# Patient Record
Sex: Female | Born: 1935 | Hispanic: No | Smoking: Never smoker
Health system: Southern US, Community
[De-identification: ages and names within clinical notes are randomized; demographics above are authoritative.]

## PROBLEM LIST (undated history)

## (undated) DIAGNOSIS — F0280 Dementia in other diseases classified elsewhere without behavioral disturbance: Secondary | ICD-10-CM

## (undated) DIAGNOSIS — J449 Chronic obstructive pulmonary disease, unspecified: Secondary | ICD-10-CM

## (undated) DIAGNOSIS — G309 Alzheimer's disease, unspecified: Secondary | ICD-10-CM

## (undated) DIAGNOSIS — I10 Essential (primary) hypertension: Secondary | ICD-10-CM

---

## 2014-07-21 ENCOUNTER — Emergency Department (HOSPITAL_BASED_OUTPATIENT_CLINIC_OR_DEPARTMENT_OTHER): Payer: Medicare Other

## 2014-07-21 ENCOUNTER — Encounter (HOSPITAL_BASED_OUTPATIENT_CLINIC_OR_DEPARTMENT_OTHER): Payer: Self-pay | Admitting: *Deleted

## 2014-07-21 ENCOUNTER — Emergency Department (HOSPITAL_BASED_OUTPATIENT_CLINIC_OR_DEPARTMENT_OTHER)
Admission: EM | Admit: 2014-07-21 | Discharge: 2014-07-21 | Disposition: A | Discharge status: Home / Self Care | Payer: Medicare Other | Attending: Emergency Medicine | Admitting: Emergency Medicine

## 2014-07-21 DIAGNOSIS — Y9389 Activity, other specified: Secondary | ICD-10-CM | POA: Insufficient documentation

## 2014-07-21 DIAGNOSIS — G8929 Other chronic pain: Secondary | ICD-10-CM | POA: Insufficient documentation

## 2014-07-21 DIAGNOSIS — Y998 Other external cause status: Secondary | ICD-10-CM | POA: Insufficient documentation

## 2014-07-21 DIAGNOSIS — S3992XA Unspecified injury of lower back, initial encounter: Secondary | ICD-10-CM | POA: Diagnosis not present

## 2014-07-21 DIAGNOSIS — Y92128 Other place in nursing home as the place of occurrence of the external cause: Secondary | ICD-10-CM | POA: Diagnosis not present

## 2014-07-21 DIAGNOSIS — G309 Alzheimer's disease, unspecified: Secondary | ICD-10-CM | POA: Diagnosis not present

## 2014-07-21 DIAGNOSIS — S0511XA Contusion of eyeball and orbital tissues, right eye, initial encounter: Secondary | ICD-10-CM | POA: Diagnosis not present

## 2014-07-21 DIAGNOSIS — S0083XA Contusion of other part of head, initial encounter: Secondary | ICD-10-CM

## 2014-07-21 DIAGNOSIS — W1830XA Fall on same level, unspecified, initial encounter: Secondary | ICD-10-CM | POA: Diagnosis not present

## 2014-07-21 DIAGNOSIS — W19XXXA Unspecified fall, initial encounter: Secondary | ICD-10-CM

## 2014-07-21 DIAGNOSIS — S0591XA Unspecified injury of right eye and orbit, initial encounter: Secondary | ICD-10-CM | POA: Diagnosis present

## 2014-07-21 HISTORY — DX: Essential (primary) hypertension: I10

## 2014-07-21 HISTORY — DX: Dementia in other diseases classified elsewhere, unspecified severity, without behavioral disturbance, psychotic disturbance, mood disturbance, and anxiety: F02.80

## 2014-07-21 HISTORY — DX: Alzheimer's disease, unspecified: G30.9

## 2014-07-21 MED ORDER — ACETAMINOPHEN 325 MG PO TABS
650.0000 mg | ORAL_TABLET | Freq: Once | ORAL | Status: AC
  Administered 2014-07-21: 650 mg via ORAL
  Filled 2014-07-21: qty 2

## 2014-07-21 NOTE — ED Notes (Signed)
Pt ambulated in room. Pt states legs feel weak but no pain. Will make Dr Madilyn Hookees aware.

## 2014-07-21 NOTE — ED Notes (Signed)
Pt discharged back [facility] via [facility] - Jerrye BeaversHazel, RN gave discharge report staff at facility.

## 2014-07-21 NOTE — ED Notes (Signed)
Report [facility] to [facility] and given to Poyin MT.

## 2014-07-21 NOTE — Discharge Instructions (Signed)
Ms. Heidi Williams was seen in the Emergency Department and had a CT of her head and neck performed.  There were chronic changes on these images.   You can apply an ice pack to her right eye for pain/swelling.     Facial or Scalp Contusion A facial or scalp contusion is a deep bruise on the face or head. Injuries to the face and head generally cause a lot of swelling, especially around the eyes. Contusions are the result of an injury that caused bleeding under the skin. The contusion may turn blue, purple, or yellow. Minor injuries will give you a painless contusion, but more severe contusions may stay painful and swollen for a few weeks.  CAUSES  A facial or scalp contusion is caused by a blunt injury or trauma to the face or head area.  SIGNS AND SYMPTOMS   Swelling of the injured area.   Discoloration of the injured area.   Tenderness, soreness, or pain in the injured area.  DIAGNOSIS  The diagnosis can be made by taking a medical history and doing a physical exam. An X-ray exam, CT scan, or MRI may be needed to determine if there are any associated injuries, such as broken bones (fractures). TREATMENT  Often, the best treatment for a facial or scalp contusion is applying cold compresses to the injured area. Over-the-counter medicines may also be recommended for pain control.  HOME CARE INSTRUCTIONS   Only take over-the-counter or prescription medicines as directed by your health care provider.   Apply ice to the injured area.   Put ice in a plastic bag.   Place a towel between your skin and the bag.   Leave the ice on for 20 minutes, 2-3 times a day.  SEEK MEDICAL CARE IF:  You have bite problems.   You have pain with chewing.   You are concerned about facial defects. SEEK IMMEDIATE MEDICAL CARE IF:  You have severe pain or a headache that is not relieved by medicine.   You have unusual sleepiness, confusion, or personality changes.   You throw up (vomit).   You  have a persistent nosebleed.   You have double vision or blurred vision.   You have fluid drainage from your nose or ear.   You have difficulty walking or using your arms or legs.  MAKE SURE YOU:   Understand these instructions.  Will watch your condition.  Will get help right away if you are not doing well or get worse. Document Released: 09/02/2004 Document Revised: 05/16/2013 Document Reviewed: 03/08/2013 [facility] Patient Information 2015 [place], [place]. This information is not intended to replace advice given to you by your health care provider. Make sure you discuss any questions you have with your health care provider.

## 2014-07-21 NOTE — ED Notes (Signed)
Pt arrived via [facility] from [facility] staff state she came out of room and stated she fell. C/o right swelling with bruising. No LOC. NO other injury noted.

## 2014-07-21 NOTE — ED Provider Notes (Signed)
CSN: [id number]     Arrival date & time 07/21/14  1839 History  This chart was scribed for Heidi FossaElizabeth Sloka Volante, MD by Gwenyth Oberatherine Macek, ED Scribe. This patient was seen in room MH06/MH06 and the patient's care was started at 6:40 PM.    Chief Complaint  Patient presents with  . Fall  . Head Injury   The history is provided by the patient and the EMS personnel. No language interpreter was used.    HPI Comments: Heidi Williams is a 80 y.o. female brought in by EMS, with a history of chronic back pain, who presents to the Emergency Department complaining of a hematoma over her right eye that occurred after an unwitnessed fall earlier today. She notes swelling over her right eye as an associated symptom. [facility]. The facility told EMS that pt picked herself up after the fall and ambulated to the staff for help. EMS states pt was hypertensive with a systolic BP of 168. Pt denies headache, neck pain, CP, visual disturbances and abdominal pain as associated symptoms.  No past medical history on file. No past surgical history on file. No family history on file. History  Substance Use Topics  . Smoking status: Not on file  . Smokeless tobacco: Not on file  . Alcohol Use: Not on file   OB History    No data available     Review of Systems  Eyes: Negative for visual disturbance.  Cardiovascular: Negative for chest pain.  Gastrointestinal: Negative for abdominal pain.  Musculoskeletal: Positive for back pain. Negative for arthralgias and neck pain.  Skin: Positive for wound.  Neurological: Negative for headaches.  All other systems reviewed and are negative.  Allergies  Review of patient's allergies indicates not on file.  Home Medications   Prior to Admission medications   Not on File   BP 160/104 mmHg  Pulse 69  Temp(Src) 97.2 F (36.2 C) (Oral)  Resp 18  Wt 150 lb (68.04 kg)  SpO2 98% Physical Exam  Constitutional: She appears well-developed  and well-nourished.  No acute distress  HENT:  Head: Normocephalic.  Eyes: Pupils are equal, round, and reactive to light.  Mild to moderate swelling with slight ecchymosis around the right eye.  EOMI  Cardiovascular: Normal rate and regular rhythm.   No murmur heard. Pulmonary/Chest: Effort normal and breath sounds normal. No respiratory distress.  Abdominal: Soft. There is no rebound and no guarding.  Musculoskeletal: She exhibits no edema or tenderness.  No hip tenderness  Neurological: She is alert.  Disoriented to time.  MAE symmetrically  Skin: Skin is warm and dry.  Psychiatric: She has a normal mood and affect. Her behavior is normal.  Nursing note and vitals reviewed.   ED Course  Procedures (including critical care time) DIAGNOSTIC STUDIES: Oxygen Saturation is 98% on RA, normal by my interpretation.    COORDINATION OF CARE: 6:45 PM Discussed treatment plan with pt at bedside and pt agreed to plan.  Labs Review Labs Reviewed - No data to display  Imaging Review No results found.   EKG Interpretation None      MDM   Final diagnoses:  Fall, initial encounter  Contusion of face, initial encounter    Patient presents for evaluation of injuries following a fall. Patient has a history of Alzheimer's which limits history. Patient did have an unwitnessed fall but pt was able to get up and alert staff that she fell.  Patient has periorbital  ecchymosis but no other evidence of trauma on exam. There is no evidence of acute intracranial abnormality on her CT head, no fracture on C-spine. Patient without complaints currently. Plan to DC back to facility with fall precautions, ice packs/tylenol prn facial pain.   I personally performed the services described in this documentation, which was scribed in my presence. The recorded information has been reviewed and is accurate.    Heidi FossaElizabeth Precilla Purnell, MD 07/21/14 [phone]

## 2014-10-15 ENCOUNTER — Encounter (HOSPITAL_BASED_OUTPATIENT_CLINIC_OR_DEPARTMENT_OTHER): Payer: Self-pay | Admitting: *Deleted

## 2014-10-15 ENCOUNTER — Emergency Department (HOSPITAL_BASED_OUTPATIENT_CLINIC_OR_DEPARTMENT_OTHER): Payer: Medicare Other

## 2014-10-15 ENCOUNTER — Emergency Department (HOSPITAL_BASED_OUTPATIENT_CLINIC_OR_DEPARTMENT_OTHER)
Admission: EM | Admit: 2014-10-15 | Discharge: 2014-10-15 | Disposition: A | Discharge status: Home / Self Care | Payer: Medicare Other | Attending: Emergency Medicine | Admitting: Emergency Medicine

## 2014-10-15 ENCOUNTER — Other Ambulatory Visit: Payer: Self-pay

## 2014-10-15 DIAGNOSIS — Z7952 Long term (current) use of systemic steroids: Secondary | ICD-10-CM | POA: Diagnosis not present

## 2014-10-15 DIAGNOSIS — G309 Alzheimer's disease, unspecified: Secondary | ICD-10-CM | POA: Insufficient documentation

## 2014-10-15 DIAGNOSIS — I10 Essential (primary) hypertension: Secondary | ICD-10-CM | POA: Insufficient documentation

## 2014-10-15 DIAGNOSIS — Z79899 Other long term (current) drug therapy: Secondary | ICD-10-CM | POA: Diagnosis not present

## 2014-10-15 DIAGNOSIS — R109 Unspecified abdominal pain: Secondary | ICD-10-CM | POA: Diagnosis present

## 2014-10-15 DIAGNOSIS — J449 Chronic obstructive pulmonary disease, unspecified: Secondary | ICD-10-CM | POA: Insufficient documentation

## 2014-10-15 DIAGNOSIS — K429 Umbilical hernia without obstruction or gangrene: Secondary | ICD-10-CM | POA: Insufficient documentation

## 2014-10-15 HISTORY — DX: Chronic obstructive pulmonary disease, unspecified: J44.9

## 2014-10-15 LAB — CBC WITH DIFFERENTIAL/PLATELET
Basophils Absolute: 0 10*3/uL (ref 0.0–0.1)
Basophils Relative: 1 % (ref 0–1)
Eosinophils Absolute: 0.1 10*3/uL (ref 0.0–0.7)
Eosinophils Relative: 3 % (ref 0–5)
HCT: 38.3 % (ref 36.0–46.0)
HEMOGLOBIN: 12.6 g/dL (ref 12.0–15.0)
LYMPHS ABS: 0.9 10*3/uL (ref 0.7–4.0)
LYMPHS PCT: 22 % (ref 12–46)
MCH: 35.1 pg — AB (ref 26.0–34.0)
MCHC: 32.9 g/dL (ref 30.0–36.0)
MCV: 106.7 fL — ABNORMAL HIGH (ref 78.0–100.0)
MONO ABS: 0.5 10*3/uL (ref 0.1–1.0)
Monocytes Relative: 11 % (ref 3–12)
NEUTROS ABS: 2.6 10*3/uL (ref 1.7–7.7)
Neutrophils Relative %: 63 % (ref 43–77)
Platelets: 155 10*3/uL (ref 150–400)
RBC: 3.59 MIL/uL — ABNORMAL LOW (ref 3.87–5.11)
RDW: 14.5 % (ref 11.5–15.5)
WBC: 4.1 10*3/uL (ref 4.0–10.5)

## 2014-10-15 LAB — COMPREHENSIVE METABOLIC PANEL
ALBUMIN: 4.1 g/dL (ref 3.5–5.2)
ALT: 77 U/L — AB (ref 0–35)
AST: 73 U/L — AB (ref 0–37)
Alkaline Phosphatase: 366 U/L — ABNORMAL HIGH (ref 39–117)
Anion gap: 2 — ABNORMAL LOW (ref 5–15)
BILIRUBIN TOTAL: 1 mg/dL (ref 0.3–1.2)
BUN: 16 mg/dL (ref 6–23)
CHLORIDE: 112 mmol/L (ref 96–112)
CO2: 24 mmol/L (ref 19–32)
Calcium: 8.9 mg/dL (ref 8.4–10.5)
Creatinine, Ser: 0.87 mg/dL (ref 0.50–1.10)
GFR calc Af Amer: 68 mL/min — ABNORMAL LOW (ref >90)
GFR calc non Af Amer: 59 mL/min — ABNORMAL LOW (ref >90)
Glucose, Bld: 100 mg/dL — ABNORMAL HIGH (ref 70–99)
Potassium: 4.2 mmol/L (ref 3.5–5.1)
Sodium: 138 mmol/L (ref 135–145)
Total Protein: 7 g/dL (ref 6.0–8.3)

## 2014-10-15 LAB — URINALYSIS, ROUTINE W REFLEX MICROSCOPIC
BILIRUBIN URINE: NEGATIVE
Glucose, UA: NEGATIVE mg/dL
HGB URINE DIPSTICK: NEGATIVE
Ketones, ur: NEGATIVE mg/dL
Leukocytes, UA: NEGATIVE
Nitrite: NEGATIVE
Protein, ur: NEGATIVE mg/dL
Specific Gravity, Urine: 1.01 (ref 1.005–1.030)
Urobilinogen, UA: 0.2 mg/dL (ref 0.0–1.0)
pH: 6 (ref 5.0–8.0)

## 2014-10-15 LAB — TROPONIN I

## 2014-10-15 LAB — OCCULT BLOOD X 1 CARD TO LAB, STOOL: FECAL OCCULT BLD: NEGATIVE

## 2014-10-15 LAB — LIPASE, BLOOD: Lipase: 36 U/L (ref 11–59)

## 2014-10-15 LAB — I-STAT CG4 LACTIC ACID, ED: Lactic Acid, Venous: 0.89 mmol/L (ref 0.5–2.0)

## 2014-10-15 MED ORDER — IOHEXOL 300 MG/ML  SOLN
100.0000 mL | Freq: Once | INTRAMUSCULAR | Status: AC | PRN
  Administered 2014-10-15: 100 mL via INTRAVENOUS

## 2014-10-15 MED ORDER — SODIUM CHLORIDE 0.9 % IV SOLN
INTRAVENOUS | Status: DC
  Administered 2014-10-15: 15:00:00 via INTRAVENOUS

## 2014-10-15 NOTE — ED Notes (Signed)
C/o feeling a small mass to left mid abd slightly below umbilicus. N/v/d.

## 2014-10-15 NOTE — ED Notes (Signed)
Patient c/o lower central abd pain, mass felt by EMS, positive bowel sounds

## 2014-10-15 NOTE — ED Provider Notes (Signed)
CSN: [id number]     Arrival date & time 10/15/14  1354 History   None    Chief Complaint  Patient presents with  . Abdominal Pain     (Consider location/radiation/quality/duration/timing/severity/associated sxs/prior Treatment) Patient is a 81 y.o. female presenting with abdominal pain. A language interpreter was used.  Abdominal Pain    Heidi Williams is a 81 y.o. female [facility] with past medical history significant for Alzheimer's, hypertension and COPD complaining of epigastric abdominal pain that was 6 out of 10 at worst, this pain has largely resolved. Patient denies fever, chills, nausea, vomiting, change in bowel or bladder habits.   Past Medical History  Diagnosis Date  . Alzheimer disease   . Hypertension   . COPD (chronic obstructive pulmonary disease)    History reviewed. No pertinent past surgical history. No family history on file. History  Substance Use Topics  . Smoking status: Never Smoker   . Smokeless tobacco: Not on file  . Alcohol Use: Not on file   OB History    No data available     Review of Systems  Gastrointestinal: Positive for abdominal pain.    10 systems reviewed and found to be negative, except as noted in the HPI.   Allergies  Morphine and related  Home Medications   Prior to Admission medications   Medication Sig Start Date End Date Taking? Authorizing Provider  ALPRAZolam (XANAX) 0.25 MG tablet Take 0.25 mg by mouth 2 (two) times daily.   Yes Historical Provider, MD  donepezil (ARICEPT) 10 MG tablet Take 10 mg by mouth at bedtime.   Yes Historical Provider, MD  ergocalciferol (VITAMIN D2) 50000 UNITS capsule Take 50,000 Units by mouth once a week.   Yes Historical Provider, MD  ferrous sulfate 325 (65 FE) MG tablet Take 325 mg by mouth daily with breakfast.   Yes Historical Provider, MD  fluticasone-salmeterol (ADVAIR HFA) 115-21 MCG/ACT inhaler Inhale 2 puffs into the lungs 2 (two)  times daily.   Yes Historical Provider, MD  gabapentin (NEURONTIN) 600 MG tablet Take 600 mg by mouth 2 (two) times daily.   Yes Historical Provider, MD  lactobacillus acidophilus (BACID) TABS tablet Take 2 tablets by mouth 2 (two) times daily.   Yes Historical Provider, MD  levothyroxine (SYNTHROID, LEVOTHROID) 50 MCG tablet Take 50 mcg by mouth daily before breakfast.   Yes Historical Provider, MD  lisinopril (PRINIVIL,ZESTRIL) 20 MG tablet Take 20 mg by mouth daily.   Yes Historical Provider, MD  memantine (NAMENDA) 10 MG tablet Take 28 mg by mouth daily.   Yes Historical Provider, MD  metoprolol tartrate (LOPRESSOR) 25 MG tablet Take 12.5 mg by mouth 2 (two) times daily.   Yes Historical Provider, MD  omeprazole (PRILOSEC) 20 MG capsule Take 20 mg by mouth daily.   Yes Historical Provider, MD  simvastatin (ZOCOR) 20 MG tablet Take 20 mg by mouth daily.   Yes Historical Provider, MD  traZODone (DESYREL) 50 MG tablet Take 50 mg by mouth at bedtime.   Yes Historical Provider, MD  docusate sodium (COLACE) 100 MG capsule Take 100 mg by mouth 2 (two) times daily.    Historical Provider, MD  ipratropium-albuterol (DUONEB) 0.5-2.5 (3) MG/3ML SOLN Take 3 mLs by nebulization.    Historical Provider, MD  nitroGLYCERIN (NITROSTAT) 0.4 MG SL tablet Place 0.4 mg under the tongue every 5 (five) minutes as needed for chest pain.    Historical Provider, MD  BP 163/83 mmHg  Pulse 62  Temp(Src) 98.4 F (36.9 C) (Oral)  Resp 16  Wt 150 lb (68.04 kg)  SpO2 94% Physical Exam  Constitutional: She is oriented to person, place, and time. She appears well-developed and well-nourished. No distress.  HENT:  Head: Normocephalic.  Eyes: Conjunctivae and EOM are normal.  Cardiovascular: Normal rate.   Pulmonary/Chest: Effort normal. No stridor.  Abdominal: Soft. Bowel sounds are normal. She exhibits no distension and no mass. There is no tenderness. There is no rebound and no guarding.  Vertical lower midline  abdominal scar  Genitourinary:  Digital rectal exam with no rashes or lesions, good rectal tone, dark normally formed stool  Musculoskeletal: Normal range of motion.  Neurological: She is alert and oriented to person, place, and time.  Psychiatric: She has a normal mood and affect.  Nursing note and vitals reviewed.   ED Course  Procedures (including critical care time) Labs Review Labs Reviewed  CBC WITH DIFFERENTIAL/PLATELET - Abnormal; Notable for the following:    RBC 3.59 (*)    MCV 106.7 (*)    MCH 35.1 (*)    All other components within normal limits  COMPREHENSIVE METABOLIC PANEL - Abnormal; Notable for the following:    Glucose, Bld 100 (*)    AST 73 (*)    ALT 77 (*)    Alkaline Phosphatase 366 (*)    GFR calc non Af Amer 59 (*)    GFR calc Af Amer 68 (*)    Anion gap 2 (*)    All other components within normal limits  LIPASE, BLOOD  URINALYSIS, ROUTINE W REFLEX MICROSCOPIC  TROPONIN I  OCCULT BLOOD X 1 CARD TO LAB, STOOL  I-STAT CG4 LACTIC ACID, ED  I-STAT CG4 LACTIC ACID, ED    Imaging Review No results found.   EKG Interpretation None      MDM   Final diagnoses:  Umbilical hernia without obstruction and without gangrene    Filed Vitals:   10/15/14 1401  BP: 163/83  Pulse: 62  Temp: 98.4 F (36.9 C)  TempSrc: Oral  Resp: 16  Weight: 150 lb (68.04 kg)  SpO2: 94%    Medications  0.9 %  sodium chloride infusion ( Intravenous New Bag/Given 10/15/14 1517)    Heidi Williams is a pleasant 81 y.o. female presenting with resolved epigastric abdominal pain. Abdominal exam is benign. Stool is dark, but guaiac is negative. Blood work reassuring, mild transaminitis and increase in alkaline phosphatase.  CT abdomen pelvis with small fat containing umbilical hernia. Serial abdominal exams remain benign.   This is a shared visit with the attending physician who personally evaluated the patient and agrees with the care plan.   Evaluation does not show  pathology that would require ongoing emergent intervention or inpatient treatment. Pt is hemodynamically stable and mentating appropriately. Discussed findings and plan with patient/guardian, who agrees with care plan. All questions answered. Return precautions discussed and outpatient follow up given.      Joni Reining Danzell Birky, PA-C 10/15/14 1610  Pricilla Loveless, MD 10/24/14 [phone]

## 2014-10-15 NOTE — Discharge Instructions (Signed)
Take acetaminophen (Tylenol) up to650 mg (this is normally 2 over-the-counter pills) up to 3 times a day. Do not drink alcohol. Make sure your other medications do not contain acetaminophen (Read the labels!)  Please follow with your primary care doctor in the next 2 days for a check-up. They must obtain records for further management.   Do not hesitate to return to the Emergency Department for any new, worsening or concerning symptoms.    Hernia A hernia occurs when an internal organ pushes out through a weak spot in the abdominal wall. Hernias most commonly occur in the groin and around the navel. Hernias often can be pushed back into place (reduced). Most hernias tend to get worse over time. Some abdominal hernias can get stuck in the opening (irreducible or incarcerated hernia) and cannot be reduced. An irreducible abdominal hernia which is tightly squeezed into the opening is at risk for impaired blood supply (strangulated hernia). A strangulated hernia is a medical emergency. Because of the risk for an irreducible or strangulated hernia, surgery may be recommended to repair a hernia. CAUSES   Heavy lifting.  Prolonged coughing.  Straining to have a bowel movement.  A cut (incision) made during an abdominal surgery. HOME CARE INSTRUCTIONS   Bed rest is not required. You may continue your normal activities.  Avoid lifting more than 10 pounds (4.5 kg) or straining.  Cough gently. If you are a smoker it is best to stop. Even the best hernia repair can break down with the continual strain of coughing. Even if you do not have your hernia repaired, a cough will continue to aggravate the problem.  Do not wear anything tight over your hernia. Do not try to keep it in with an outside bandage or truss. These can damage abdominal contents if they are trapped within the hernia sac.  Eat a normal diet.  Avoid constipation. Straining over long periods of time will increase hernia size and  encourage breakdown of repairs. If you cannot do this with diet alone, stool softeners may be used. SEEK IMMEDIATE MEDICAL CARE IF:   You have a fever.  You develop increasing abdominal pain.  You feel nauseous or vomit.  Your hernia is stuck outside the abdomen, looks discolored, feels hard, or is tender.  You have any changes in your bowel habits or in the hernia that are unusual for you.  You have increased pain or swelling around the hernia.  You cannot push the hernia back in place by applying gentle pressure while lying down. MAKE SURE YOU:   Understand these instructions.  Will watch your condition.  Will get help right away if you are not doing well or get worse. Document Released: 07/26/2005 Document Revised: 10/18/2011 Document Reviewed: 03/14/2008 [facility] Patient Information 2015 [place], [place]. This information is not intended to replace advice given to you by your health care provider. Make sure you discuss any questions you have with your health care provider.

## 2014-10-15 NOTE — ED Notes (Signed)
Report given to [facility] 

## 2014-10-15 NOTE — ED Notes (Signed)
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 "She reports that sometimes when the she the patient)is not getting her way she cries wolf" Selena BattenKim Daughter [phone] please call if needed

## 2016-08-22 IMAGING — CT CT ABD-PELV W/ CM
2 of 5 series · 17 of 46 positions shown, 19 images · IV contrast (omnipaque)
Comparison: 05/10/2014

CLINICAL DATA: Mid abdominal pain below the umbilicus with palpable
mass

EXAM:
CT ABDOMEN AND PELVIS WITH CONTRAST
TECHNIQUE: Multidetector CT imaging of the abdomen and pelvis was performed
using the standard protocol following bolus administration of
intravenous contrast.
CONTRAST:  100mL OMNIPAQUE IOHEXOL 300 MG/ML  SOLN

[Series 2: abd/pelvis 5.0 b31f · axial · 0.71mm/px · z∈[-500,-140]mm · 14 of 81 slices shown, 16 images]
[im 5/81  soft-tissue]
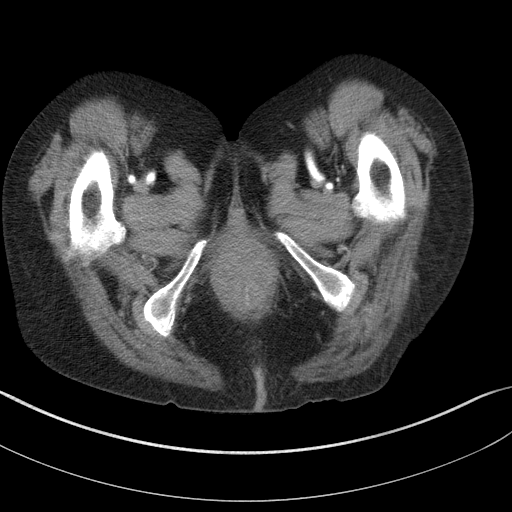
[im 5/81  bone]
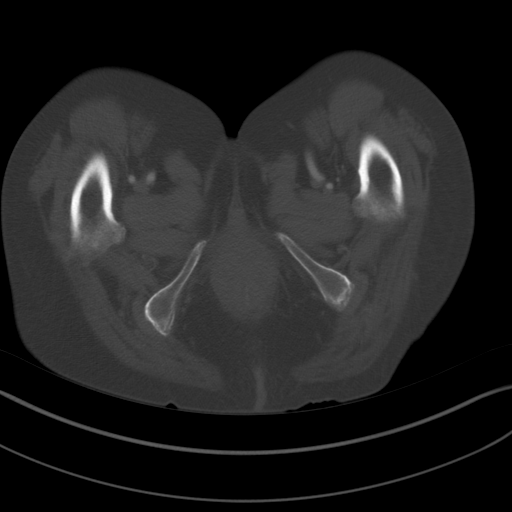
[im 13/81  soft-tissue]
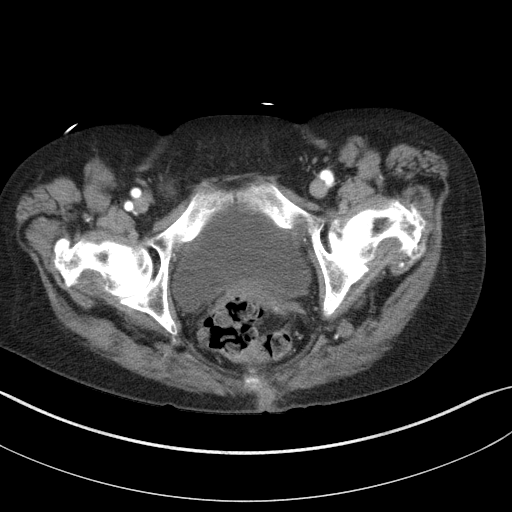
[im 17/81  soft-tissue]
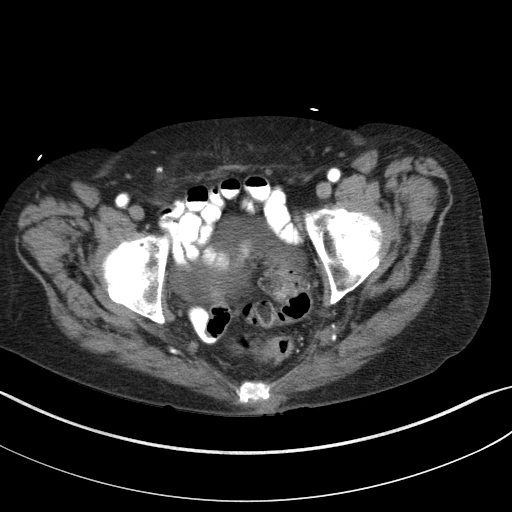
[im 21/81  soft-tissue]
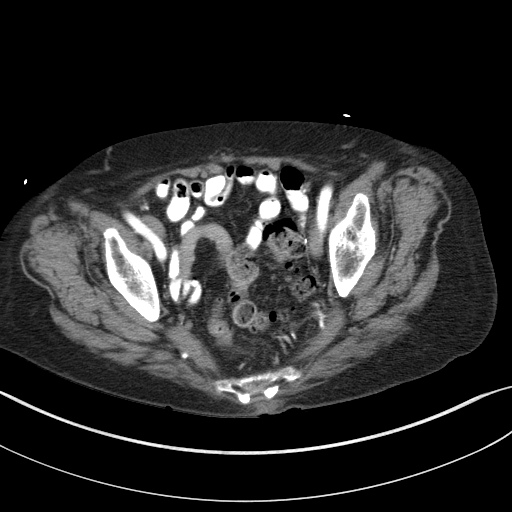
[im 29/81  soft-tissue]
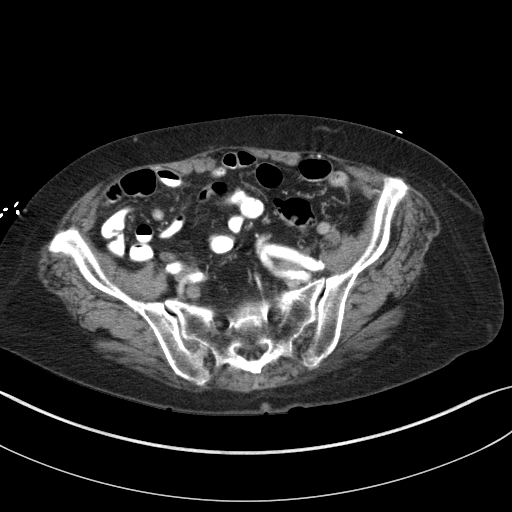
[im 33/81  soft-tissue]
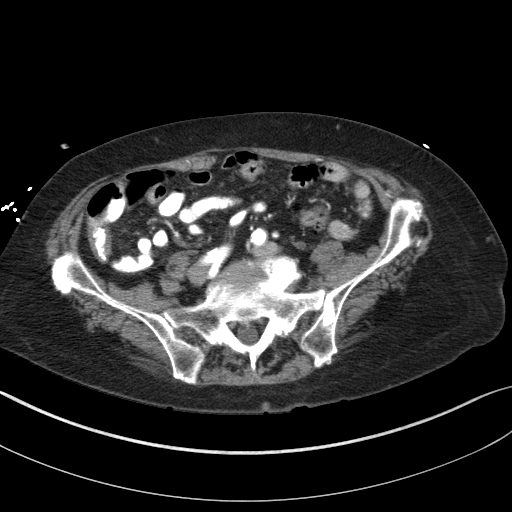
[im 37/81  soft-tissue]
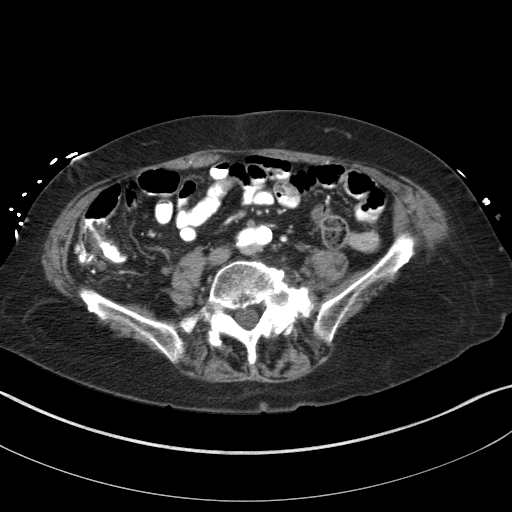
[im 45/81  soft-tissue]
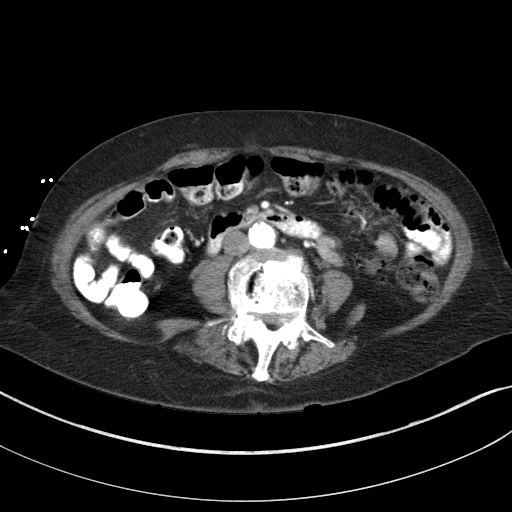
[im 49/81  soft-tissue]
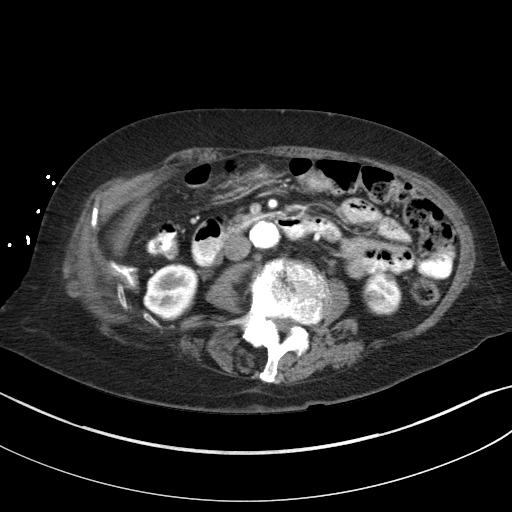
[im 49/81  bone]
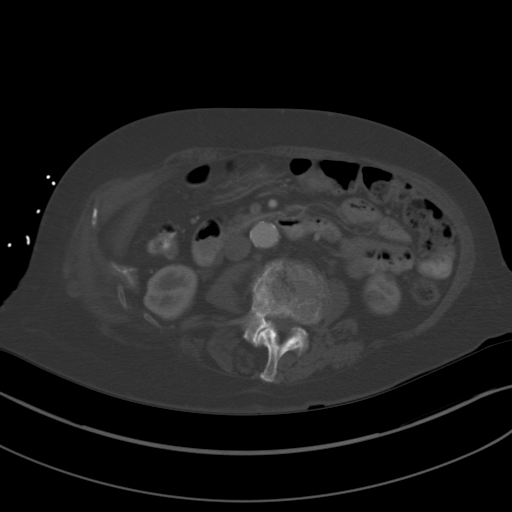
[im 53/81  soft-tissue]
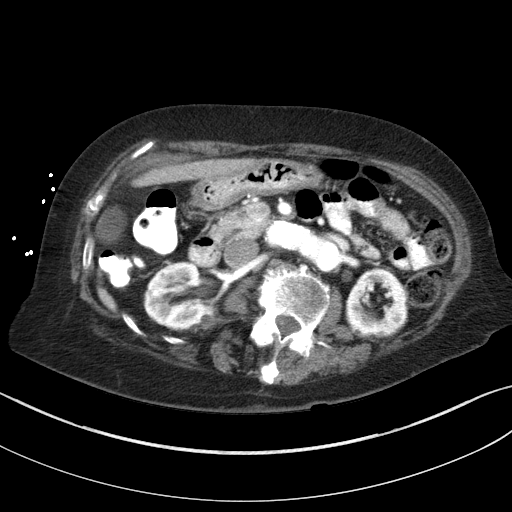
[im 61/81  soft-tissue]
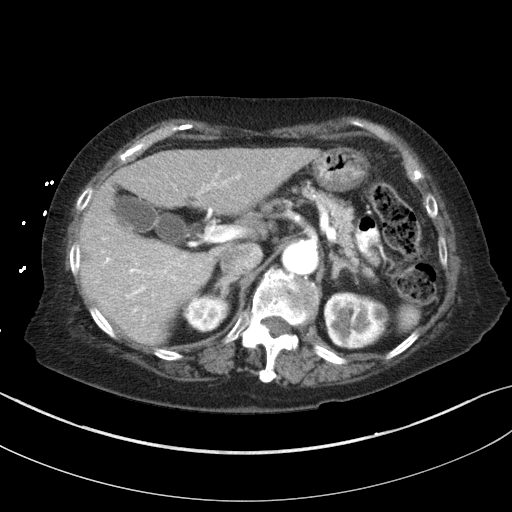
[im 65/81  soft-tissue]
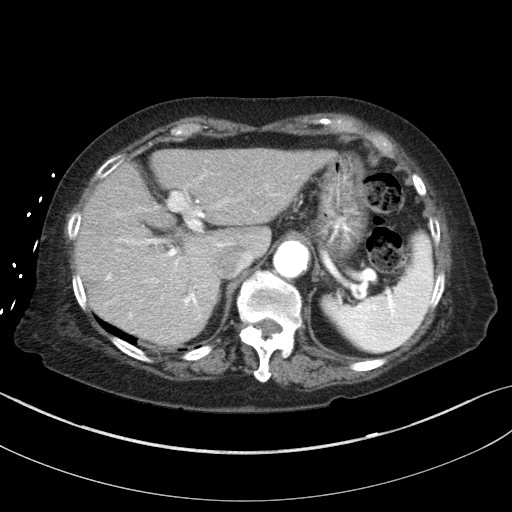
[im 69/81  soft-tissue]
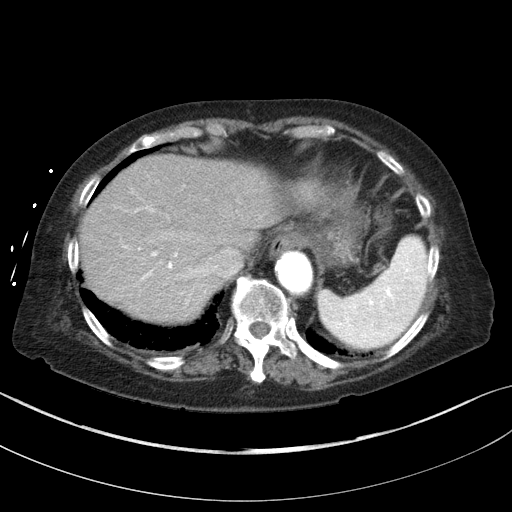
[im 77/81  soft-tissue]
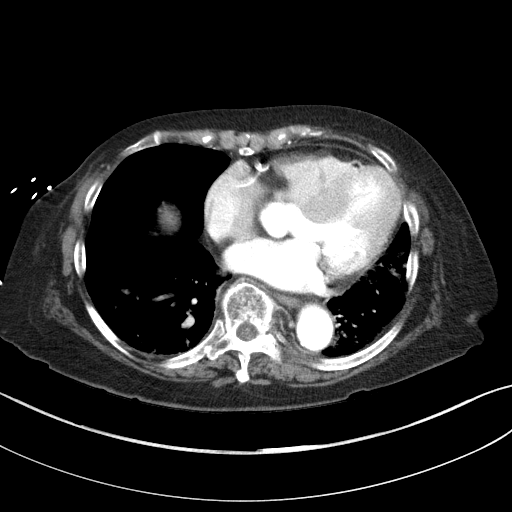

[Series 5: abd/pelvis 3.0 coronal · coronal · 0.77mm/px · 3 of 73 slices shown]
[im 25/73  soft-tissue]
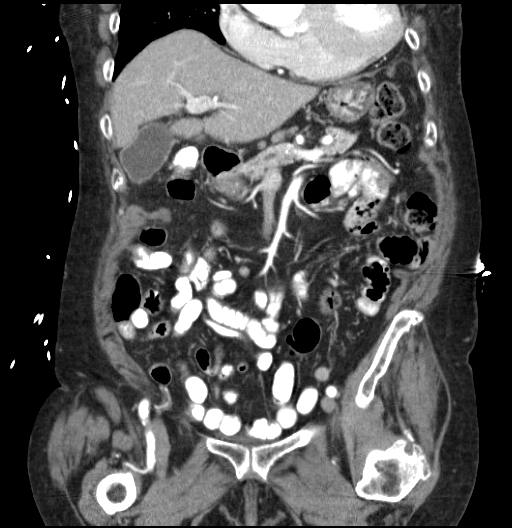
[im 33/73  soft-tissue]
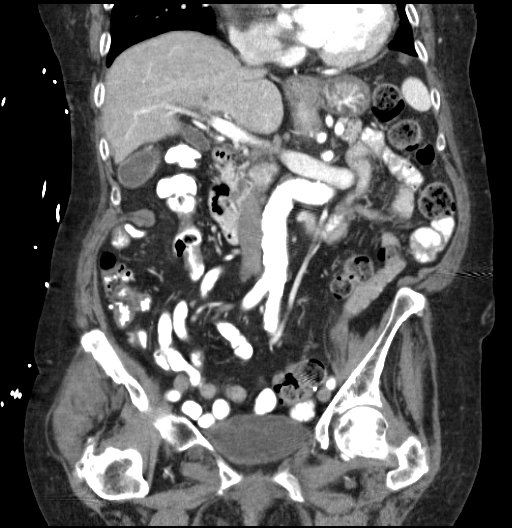
[im 41/73  soft-tissue]
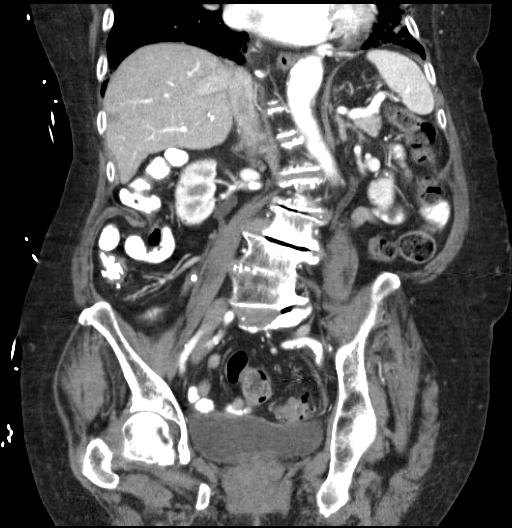

[17 of 46 positions shown; findings below may reference images not displayed]

FINDINGS: Lung bases are free of acute infiltrate or sizable effusion. Minimal
dependent atelectatic changes are seen.

The liver is diffusely decreased in attenuation consistent with
fatty infiltration. The gallbladder is unremarkable as is the
spleen. The adrenal glands and pancreas are within normal limits.
The kidneys demonstrate bilateral cystic change. No renal calculi or
obstructive changes are noted.

The appendix is not well visualized although no inflammatory changes
to suggest appendicitis are seen. Diverticulosis without
diverticulitis is noted. The bladder is well distended. Bony
structures demonstrate significant degenerative change of the lumbar
spine as well as a scoliosis concave to the right.

Small fat containing umbilical hernia is noted but stable from the
prior exam. This may be related to the palpable abnormality. No
other focal mass lesion is seen.
IMPRESSION: Small fat containing umbilical hernia which may correspond with the
patient's abnormality. No other subcutaneous mass lesion is noted.

Chronic changes without acute abnormality.

## 2018-09-09 DEATH — deceased
